# Patient Record
Sex: Female | Born: 1985 | Race: Black or African American | Hispanic: No | Marital: Single | Smoking: Never smoker
Health system: Southern US, Community
[De-identification: ages and names within clinical notes are randomized; demographics above are authoritative.]

## PROBLEM LIST (undated history)

## (undated) ENCOUNTER — Inpatient Hospital Stay (HOSPITAL_COMMUNITY): Payer: Self-pay

---

## 2015-07-18 ENCOUNTER — Encounter (HOSPITAL_COMMUNITY): Payer: Self-pay | Admitting: *Deleted

## 2015-07-18 ENCOUNTER — Inpatient Hospital Stay (HOSPITAL_COMMUNITY)
Admission: AD | Admit: 2015-07-18 | Discharge: 2015-07-18 | Disposition: A | Discharge status: Home / Self Care | Payer: 59 | Source: Ambulatory Visit | Attending: Obstetrics and Gynecology | Admitting: Obstetrics and Gynecology

## 2015-07-18 ENCOUNTER — Inpatient Hospital Stay (HOSPITAL_COMMUNITY): Payer: 59

## 2015-07-18 DIAGNOSIS — O039 Complete or unspecified spontaneous abortion without complication: Secondary | ICD-10-CM

## 2015-07-18 DIAGNOSIS — O209 Hemorrhage in early pregnancy, unspecified: Secondary | ICD-10-CM | POA: Diagnosis present

## 2015-07-18 DIAGNOSIS — Z3A01 Less than 8 weeks gestation of pregnancy: Secondary | ICD-10-CM | POA: Diagnosis not present

## 2015-07-18 LAB — URINALYSIS, ROUTINE W REFLEX MICROSCOPIC
Bilirubin Urine: NEGATIVE
GLUCOSE, UA: NEGATIVE mg/dL
Ketones, ur: NEGATIVE mg/dL
LEUKOCYTES UA: NEGATIVE
Nitrite: NEGATIVE
PH: 5.5 (ref 5.0–8.0)
PROTEIN: NEGATIVE mg/dL
SPECIFIC GRAVITY, URINE: 1.02 (ref 1.005–1.030)

## 2015-07-18 LAB — URINE MICROSCOPIC-ADD ON

## 2015-07-18 LAB — CBC WITH DIFFERENTIAL/PLATELET
BASOS ABS: 0 10*3/uL (ref 0.0–0.1)
BASOS PCT: 0 %
EOS PCT: 2 %
Eosinophils Absolute: 0.2 10*3/uL (ref 0.0–0.7)
HEMATOCRIT: 30.7 % — AB (ref 36.0–46.0)
HEMOGLOBIN: 10.5 g/dL — AB (ref 12.0–15.0)
LYMPHS PCT: 44 %
Lymphs Abs: 4.1 10*3/uL — ABNORMAL HIGH (ref 0.7–4.0)
MCH: 21.8 pg — AB (ref 26.0–34.0)
MCHC: 34.2 g/dL (ref 30.0–36.0)
MCV: 63.8 fL — ABNORMAL LOW (ref 78.0–100.0)
MONO ABS: 0.3 10*3/uL (ref 0.1–1.0)
MONOS PCT: 3 %
NEUTROS PCT: 51 %
Neutro Abs: 4.8 10*3/uL (ref 1.7–7.7)
OTHER: 0 %
Platelets: 316 10*3/uL (ref 150–400)
RBC: 4.81 MIL/uL (ref 3.87–5.11)
RDW: 18.2 % — ABNORMAL HIGH (ref 11.5–15.5)
WBC: 9.4 10*3/uL (ref 4.0–10.5)

## 2015-07-18 LAB — POCT PREGNANCY, URINE: Preg Test, Ur: POSITIVE — AB

## 2015-07-18 NOTE — Discharge Instructions (Signed)
Miscarriage  A miscarriage is the sudden loss of an unborn baby (fetus) before the 20th week of pregnancy. In an incomplete miscarriage, parts of the fetus or placenta (afterbirth) remain in the body.   Having a miscarriage can be an emotional experience. Talk with your health care provider about any questions you may have about miscarrying, the grieving process, and your future pregnancy plans.  CAUSES   · Problems with the fetal chromosomes that make it impossible for the baby to develop normally. Problems with the baby's genes or chromosomes are most often the result of errors that occur by chance as the embryo divides and grows. The problems are not inherited from the parents.  · Infection of the cervix or uterus.  · Hormone problems.  · Problems with the cervix, such as having an incompetent cervix. This is when the tissue in the cervix is not strong enough to hold the pregnancy.  · Problems with the uterus, such as an abnormally shaped uterus, uterine fibroids, or congenital abnormalities.  · Certain medical conditions.  · Smoking, drinking alcohol, or taking illegal drugs.  · Trauma.  SYMPTOMS   · Vaginal bleeding or spotting, with or without cramps or pain.  · Pain or cramping in the abdomen or lower back.  · Passing fluid, tissue, or blood clots from the vagina.  DIAGNOSIS   Your health care provider will perform a physical exam. You may also have an ultrasound to confirm the miscarriage. Blood or urine tests may also be ordered.  TREATMENT   · Usually, a dilation and curettage (D&C) procedure is performed. During a D&C procedure, the cervix is widened (dilated) and any remaining fetal or placental tissue is gently removed from the uterus.  · Antibiotic medicines are prescribed if there is an infection. Other medicines may be given to reduce the size of the uterus (contract) if there is a lot of bleeding.  · If you have Rh negative blood and your baby was Rh positive, you will need a Rho (D) immune  globulin shot. This shot will protect any future baby from having Rh blood problems in future pregnancies.  · You may be confined to bed rest. This means you should stay in bed and only get up to use the bathroom.  HOME CARE INSTRUCTIONS   · Rest as directed by your health care provider.  · Restrict activity as directed by your health care provider. You may be allowed to continue light activity if curettage was not done but you require further treatment.  · Keep track of the number of pads you use each day. Keep track of how soaked (saturated) they are. Record this information.  · Do not  use tampons.  · Do not douche or have sexual intercourse until approved by your health care provider.  · Keep all follow-up appointments for reevaluation and continuing management.  · Only take over-the-counter or prescription medicines for pain, fever, or discomfort as directed by your health care provider.  · Take antibiotic medicine as directed by your health care provider. Make sure you finish it even if you start to feel better.  SEEK IMMEDIATE MEDICAL CARE IF:   · You experience severe cramps in your stomach, back, or abdomen.  · You have an unexplained temperature (make sure to record these temperatures).  · You pass large clots or tissue (save these for your health care provider to inspect).  · Your bleeding increases.  · You become light-headed, weak, or have fainting episodes.    MAKE SURE YOU:   · Understand these instructions.  · Will watch your condition.  · Will get help right away if you are not doing well or get worse.     This information is not intended to replace advice given to you by your health care provider. Make sure you discuss any questions you have with your health care provider.     Document Released: 02/18/2005 Document Revised: 03/11/2014 Document Reviewed: 09/17/2012  Elsevier Interactive Patient Education ©2016 [employer]

## 2015-07-18 NOTE — MAU Provider Note (Signed)
History     CSN: [id number]  Arrival date and time: 07/18/15 2037   First Provider Initiated Contact with Patient 07/18/15 2114      Chief Complaint  Patient presents with  . Vaginal Bleeding   HPI  Ms. Betty Jordan is a 30 y.o. G3P0011 at [redacted]w[redacted]d who presents to MAU today with complaint of vaginal bleeding and lower abdominal pain. The states that she had +UPT in the office and had [place] that showed live SIUP at [redacted]w[redacted]d on Friday. She states bleeding started lightly on Sunday and became heavier today. She states now it is like a period with clots. She states abdominal pain rated at 3/10 now, but was 8/10 earlier. She has not taken anything for pain. She denies UTI symptoms, fever or N/V/D or constipation. She also states she was recently treated for possible GC/Chlamydia, but was then advised that testing was negative at Urgent Care.   OB History    Gravida Para Term Preterm AB TAB SAB Ectopic Multiple Living   0 1 1 0 0 0 1      History reviewed. No pertinent past medical history.  History reviewed. No pertinent past surgical history.  History reviewed. No pertinent family history.  Social History  Substance Use Topics  . Smoking status: Never Smoker   . Smokeless tobacco: None  . Alcohol Use: No    Allergies: No Known Allergies  No prescriptions prior to admission    Review of Systems  Constitutional: Negative for fever and malaise/fatigue.  Gastrointestinal: Positive for abdominal pain. Negative for nausea, vomiting, diarrhea and constipation.  Genitourinary: Negative for dysuria, urgency and frequency.       + vaginal bleeding   Physical Exam   Blood pressure 128/79, pulse 74, temperature 98.6 F (37 C), temperature source Oral, resp. rate 20, height  (1.702 m), weight 319 lb 12 oz (145.038 kg), last menstrual period 05/24/2015.  Physical Exam  Nursing note and vitals reviewed. Constitutional: She is oriented to person, place, and time. She appears  well-developed and well-nourished. No distress.  HENT:  Head: Normocephalic and atraumatic.  Cardiovascular: Normal rate.   Respiratory: Effort normal.  GI: Soft. She exhibits no distension and no mass. There is no tenderness. There is no rebound and no guarding.  Genitourinary: Uterus is tender (mild). Uterus is not enlarged (exam limited by maternal body habitus). Cervix exhibits no motion tenderness, no discharge and no friability. Right adnexum displays no mass and no tenderness. Left adnexum displays no mass and no tenderness. There is bleeding (scant blood in the vaginal vault) in the vagina. No vaginal discharge found.  Cervix: closed, thick, firm, posterior  Neurological: She is alert and oriented to person, place, and time.  Skin: Skin is warm and dry. No erythema.  Psychiatric: She has a normal mood and affect.    Results for orders placed or performed during the hospital encounter of 07/18/15 (from the past 24 hour(s))  Urinalysis, Routine w reflex microscopic (not at [facility])     Status: Abnormal   Collection Time: 07/18/15  8:59 PM  Result Value Ref Range   Color, Urine YELLOW YELLOW   APPearance CLEAR CLEAR   Specific Gravity, Urine 1.020 1.005 - 1.030   pH 5.5 5.0 - 8.0   Glucose, UA NEGATIVE NEGATIVE mg/dL   Hgb urine dipstick LARGE (A) NEGATIVE   Bilirubin Urine NEGATIVE NEGATIVE   Ketones, ur NEGATIVE NEGATIVE mg/dL   Protein, ur NEGATIVE NEGATIVE mg/dL  Nitrite NEGATIVE NEGATIVE   Leukocytes, UA NEGATIVE NEGATIVE  Urine microscopic-add on     Status: Abnormal   Collection Time: 07/18/15  8:59 PM  Result Value Ref Range   Squamous Epithelial / LPF 0-5 (A) NONE SEEN   WBC, UA 0-5 0 - 5 WBC/hpf   RBC / HPF 6-30 0 - 5 RBC/hpf   Bacteria, UA RARE (A) NONE SEEN  Pregnancy, urine POC     Status: Abnormal   Collection Time: 07/18/15  9:10 PM  Result Value Ref Range   Preg Test, Ur POSITIVE (A) NEGATIVE  CBC with Differential/Platelet     Status: Abnormal (Preliminary  result)   Collection Time: 07/18/15  9:34 PM  Result Value Ref Range   WBC 9.4 4.0 - 10.5 K/uL   RBC 4.81 3.87 - 5.11 MIL/uL   Hemoglobin 10.5 (L) 12.0 - 15.0 g/dL   HCT 16.1 (L) 09.6 - 04.5 %   MCV 63.8 (L) 78.0 - 100.0 fL   MCH 21.8 (L) 26.0 - 34.0 pg   MCHC 34.2 30.0 - 36.0 g/dL   RDW 40.9 (H) 81.1 - 91.4 %   Platelets 316 150 - 400 K/uL   Neutrophils Relative % PENDING %   Neutro Abs PENDING 1.7 - 7.7 K/uL   Band Neutrophils PENDING %   Lymphocytes Relative PENDING %   Lymphs Abs PENDING 0.7 - 4.0 K/uL   Monocytes Relative PENDING %   Monocytes Absolute PENDING 0.1 - 1.0 K/uL   Eosinophils Relative PENDING %   Eosinophils Absolute PENDING 0.0 - 0.7 K/uL   Basophils Relative PENDING %   Basophils Absolute PENDING 0.0 - 0.1 K/uL   WBC Morphology PENDING    RBC Morphology PENDING    Smear Review PENDING    Other PENDING %   nRBC PENDING 0 /100 WBC   Metamyelocytes Relative PENDING %   Myelocytes PENDING %   Promyelocytes Absolute PENDING %   Blasts PENDING %  ABO/Rh     Status: None (Preliminary result)   Collection Time: 07/18/15  9:34 PM  Result Value Ref Range   ABO/RH(D) O POS    US Ob Comp Less 14 Wks  07/18/2015  CLINICAL DATA:  Vaginal bleeding. Estimated gestational age by LMP is 7 weeks 6 days. Quantitative beta HCG is not recorded. EXAM: OBSTETRIC <14 WK [place] AND TRANSVAGINAL OB US TECHNIQUE: Both transabdominal and transvaginal ultrasound examinations were performed for complete evaluation of the gestation as well as the maternal uterus, adnexal regions, and pelvic cul-de-sac. Transvaginal technique was performed to assess early pregnancy. COMPARISON:  None. FINDINGS: Intrauterine gestational sac: Not identified. Yolk sac:  Not identified. Embryo:  Not identified. Cardiac Activity: Not identified. Maternal uterus/adnexae: Uterus is anteverted. No myometrial mass lesions identified. Endometrial stripe thickness is normal at 6 mm. No endometrial fluid collections. Cervix  is unremarkable. Both ovaries are visualized. Left ovary measures 2.8 x 1.4 x 1.8 cm. Right ovary measures 2 x 2.8 x 2.1 cm. Normal follicular changes are present. No abnormal adnexal masses. No free fluid in the pelvis. IMPRESSION: No intrauterine gestational sac, yolk sac, or fetal pole identified. Differential considerations include intrauterine pregnancy too early to be sonographically visualized, missed abortion, or ectopic pregnancy. Correlation with quantitative beta HCG is recommended. Followup ultrasound is recommended in 10-14 days for further evaluation. Electronically Signed   By: Burman Nieves M.D.   On: 07/18/2015 22:16   US Ob Transvaginal  07/18/2015  CLINICAL DATA:  Vaginal bleeding. Estimated gestational age  by LMP is 7 weeks 6 days. Quantitative beta HCG is not recorded. EXAM: OBSTETRIC <14 WK US AND TRANSVAGINAL OB US TECHNIQUE: Both transabdominal and transvaginal ultrasound examinations were performed for complete evaluation of the gestation as well as the maternal uterus, adnexal regions, and pelvic cul-de-sac. Transvaginal technique was performed to assess early pregnancy. COMPARISON:  None. FINDINGS: Intrauterine gestational sac: Not identified. Yolk sac:  Not identified. Embryo:  Not identified. Cardiac Activity: Not identified. Maternal uterus/adnexae: Uterus is anteverted. No myometrial mass lesions identified. Endometrial stripe thickness is normal at 6 mm. No endometrial fluid collections. Cervix is unremarkable. Both ovaries are visualized. Left ovary measures 2.8 x 1.4 x 1.8 cm. Right ovary measures 2 x 2.8 x 2.1 cm. Normal follicular changes are present. No abnormal adnexal masses. No free fluid in the pelvis. IMPRESSION: No intrauterine gestational sac, yolk sac, or fetal pole identified. Differential considerations include intrauterine pregnancy too early to be sonographically visualized, missed abortion, or ectopic pregnancy. Correlation with quantitative beta HCG is  recommended. Followup ultrasound is recommended in 10-14 days for further evaluation. Electronically Signed   By: Burman NievesWilliam  Stevens M.D.   On: 07/18/2015 22:16    MAU Course  Procedures None  MDM +UPT UA, CBC, ABO/Rh and US today to rule out ectopic pregnancy Discussed patient with Dr. Thana AtesGrewel. She agrees with plan for work-up today.  Discussed results with Dr. Thana AtesGrewel. She recommends discharge at this time and follow-up with Primary OB at [facility] in ~ 2 days Assessment and Plan  A: SAB  P: Discharge home Bleeding precautions discussed Patient advised to follow-up with [facility] this week  Patient may return to MAU as needed or if her condition were to change or worsen   Marny LowensteinJulie N Wenzel, PA-C  07/18/2015, 10:30 PM

## 2015-07-18 NOTE — MAU Note (Addendum)
PT SAYS SHE WENT  TO OFFICE ON Friday -  FOR  FIRST   VISIT- POSITIVE PREG-  DID VAG  U/S,   NEXT APPOINTMENT IS  Thursday.      HAD CRAMPING ON Friday ,   SPOTTING   STARTED ON Sunday   . CALLED  OFFICE YESTERDAY - TOLD  OK.   TODAY  HAD   CLOTS- NICKEL SIZE .   BLOOD  HAS  BAD  ODOR.      PAD ON IN TRIAGE -     SMALL AMT  LIGHT  PINK   .  LAST SEX- 2 WEEKS  AGO.      PT SAYS SHE HAS VAG D/C ALSO-   WENT TO URGENT   CARE-  TESTED-        GAVE MED FOR POSITIVE CX-        BUT THEN WAS CALLED     - CX NEG.

## 2015-07-19 LAB — ABO/RH: ABO/RH(D): O POS

## 2016-05-22 ENCOUNTER — Encounter (HOSPITAL_COMMUNITY): Payer: Self-pay

## 2017-12-31 IMAGING — US US OB COMP LESS 14 WK
1 series · 15 of 28 positions shown · non-contrast
Comparison: None.

CLINICAL DATA: Vaginal bleeding. Estimated gestational age by LMP
is 7 weeks 6 days. Quantitative beta HCG is not recorded.

EXAM:
OBSTETRIC <14 WK US AND TRANSVAGINAL OB US
TECHNIQUE: Both transabdominal and transvaginal ultrasound examinations were
performed for complete evaluation of the gestation as well as the
maternal uterus, adnexal regions, and pelvic cul-de-sac.
Transvaginal technique was performed to assess early pregnancy.

[Series 1: us ob comp less 14 wk · 15 of 53 slices shown]
[im 1/53]
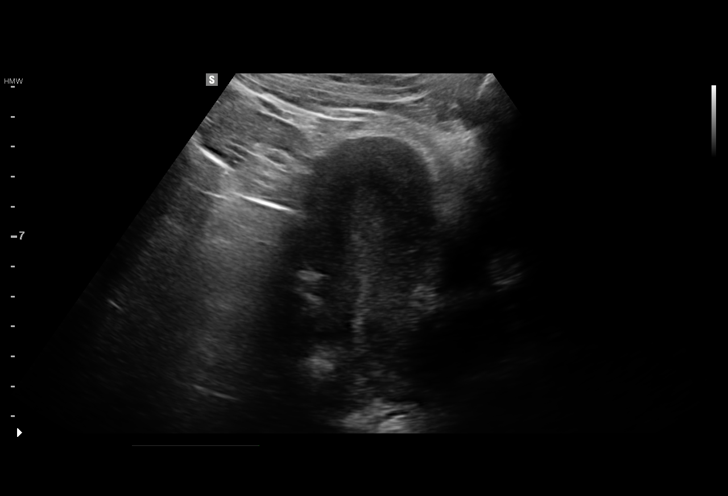
[im 4/53]
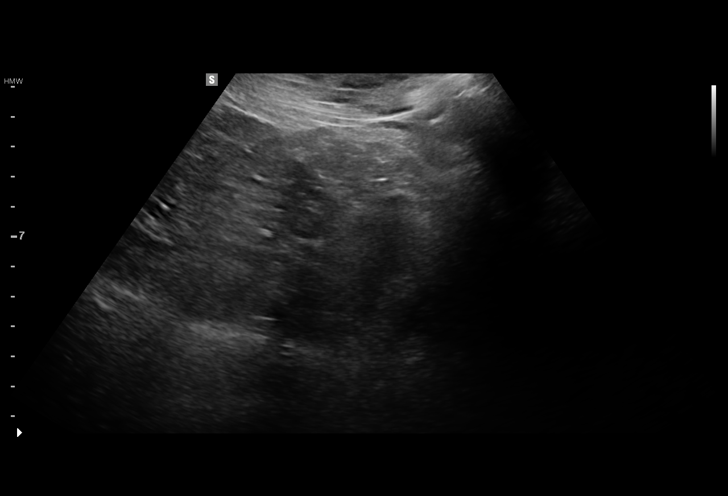
[im 8/53]
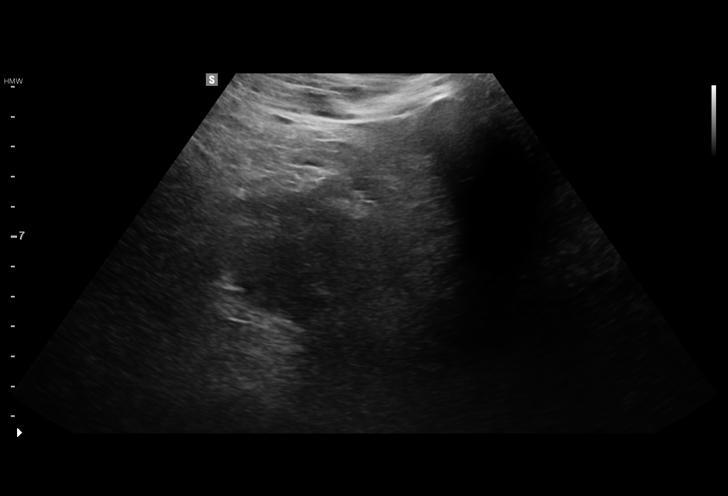
[im 12/53]
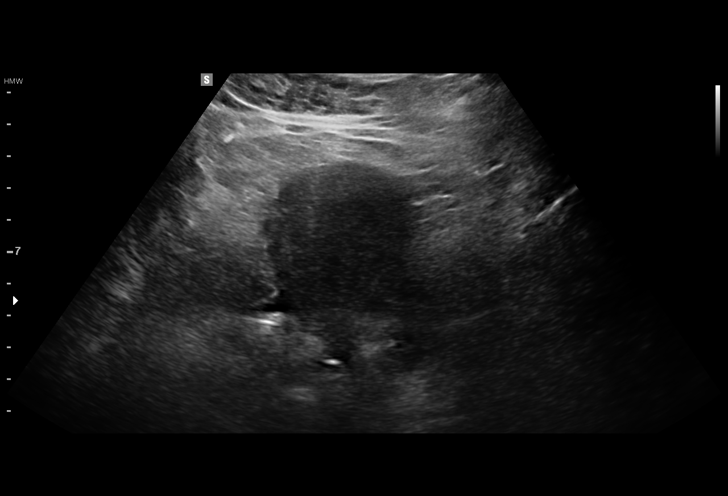
[im 16/53]
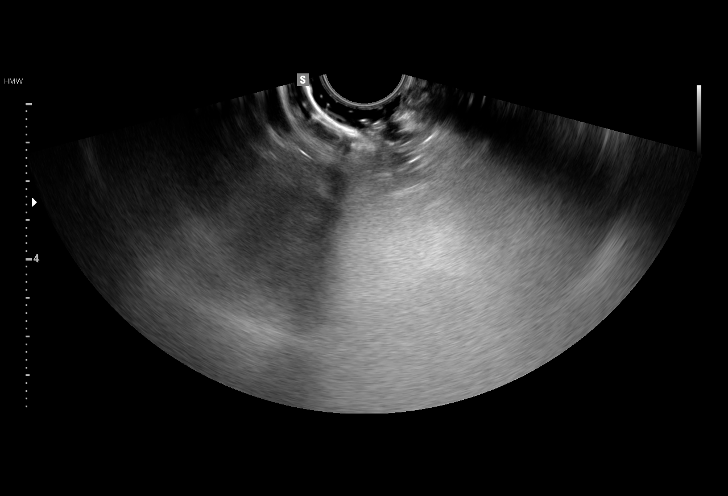
[im 20/53]
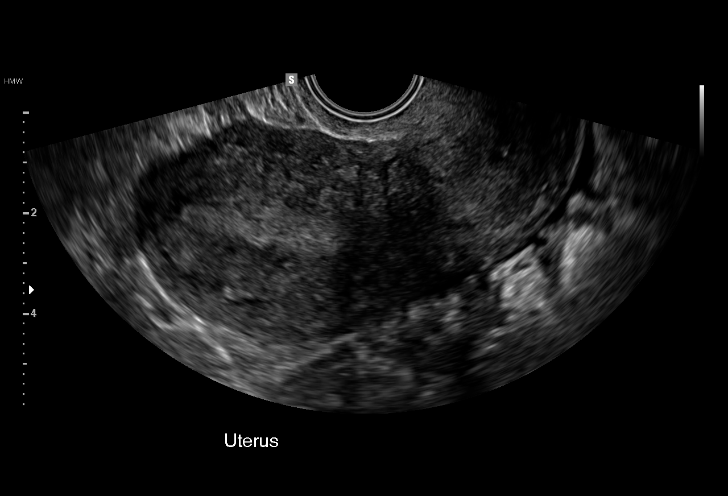
[im 24/53]
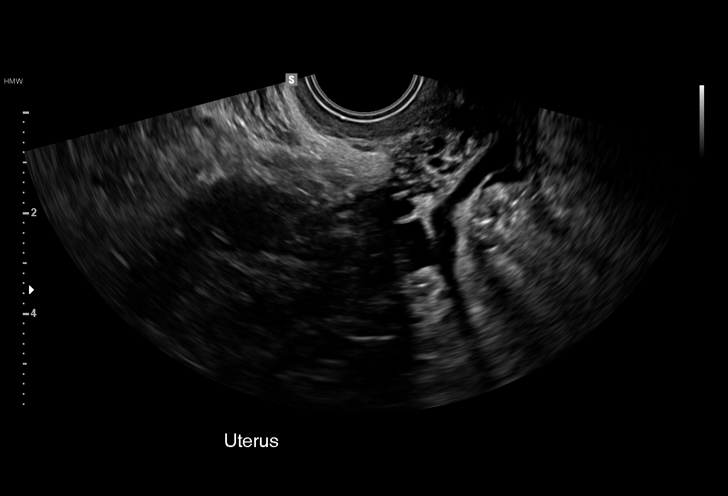
[im 27/53]
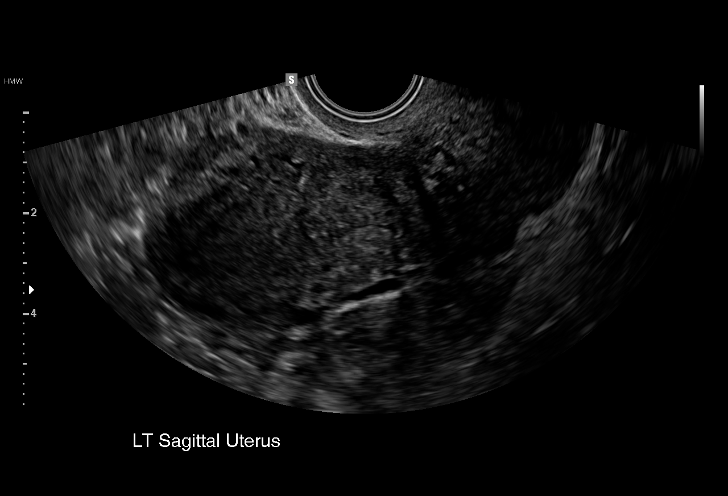
[im 29/53]
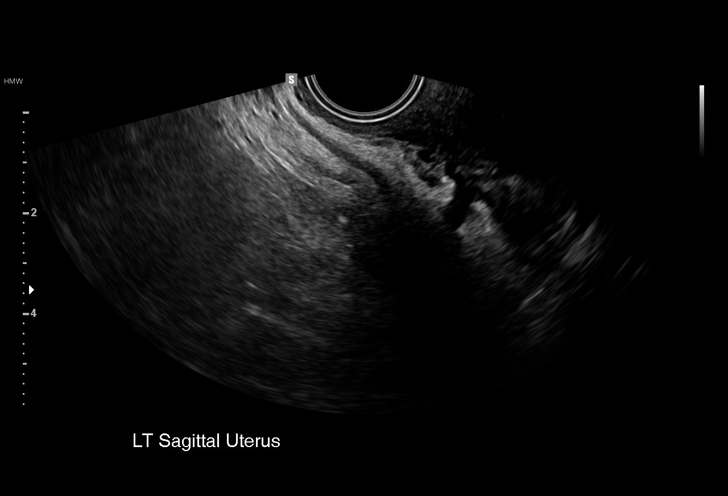
[im 33/53]
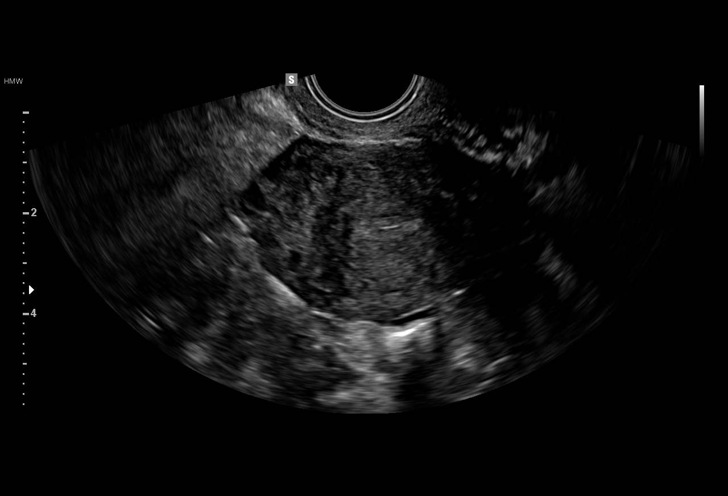
[im 37/53]
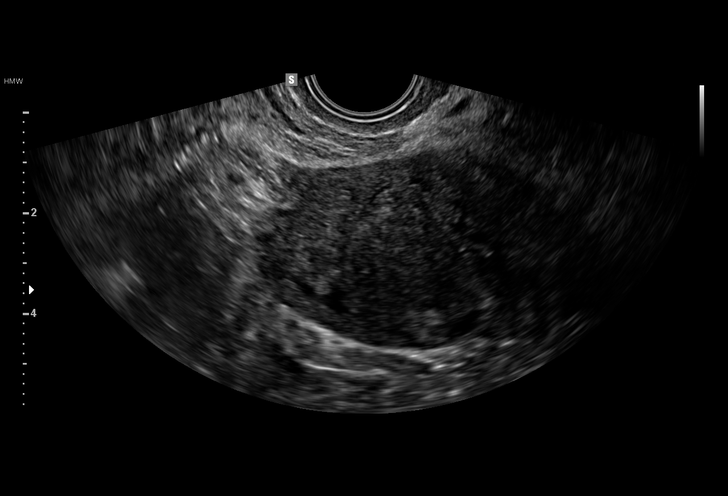
[im 41/53]
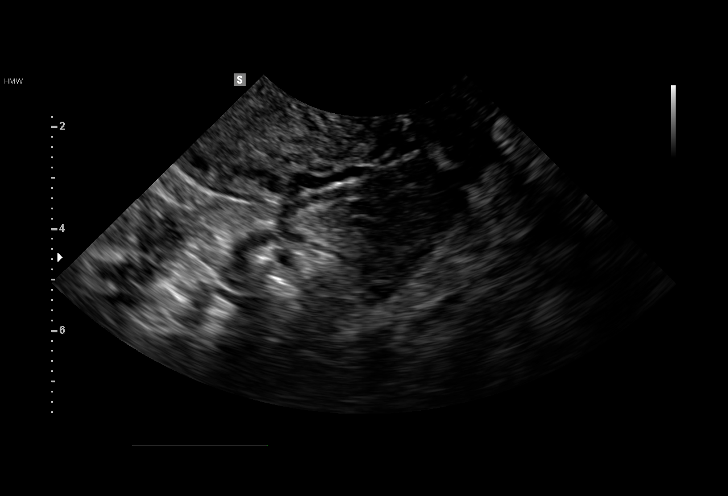
[im 45/53]
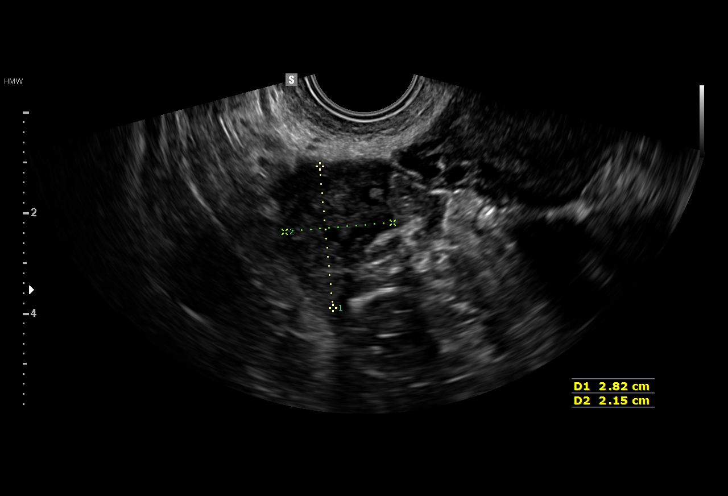
[im 49/53]
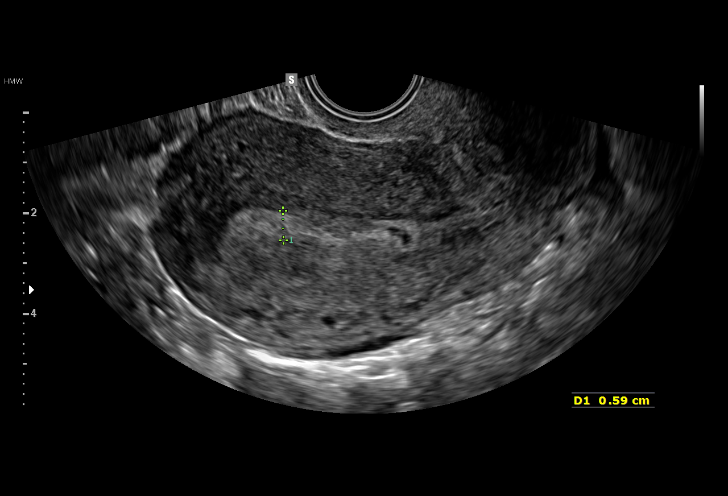
[im 53/53]
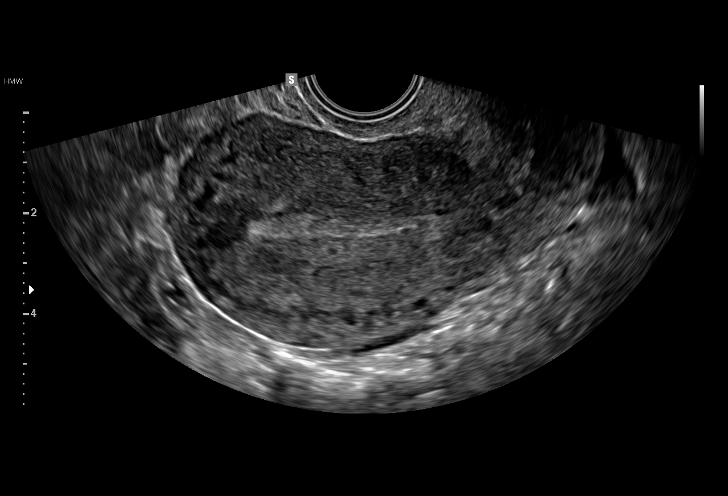

[15 of 28 positions shown; findings below may reference images not displayed]

FINDINGS: Intrauterine gestational sac: Not identified.

Yolk sac:  Not identified.

Embryo:  Not identified.

Cardiac Activity: Not identified.

Maternal uterus/adnexae: Uterus is anteverted. No myometrial mass
lesions identified. Endometrial stripe thickness is normal at 6 mm.
No endometrial fluid collections. Cervix is unremarkable. Both
ovaries are visualized. Left ovary measures 2.8 x 1.4 x 1.8 cm.
Right ovary measures 2 x 2.8 x 2.1 cm. Normal follicular changes are
present. No abnormal adnexal masses. No free fluid in the pelvis.
IMPRESSION: No intrauterine gestational sac, yolk sac, or fetal pole identified.
Differential considerations include intrauterine pregnancy too early
to be sonographically visualized, missed abortion, or ectopic
pregnancy. Correlation with quantitative beta HCG is recommended.
Followup ultrasound is recommended in 10-14 days for further
evaluation.
